# Patient Record
Sex: Male | Born: 1980 | Race: Black or African American | Hispanic: No | Marital: Single | Smoking: Never smoker
Health system: Southern US, Community
[De-identification: ages and names within clinical notes are randomized; demographics above are authoritative.]

---

## 2016-07-06 DIAGNOSIS — M7731 Calcaneal spur, right foot: Secondary | ICD-10-CM | POA: Diagnosis not present

## 2016-07-06 DIAGNOSIS — M7732 Calcaneal spur, left foot: Secondary | ICD-10-CM | POA: Diagnosis not present

## 2016-07-06 DIAGNOSIS — M722 Plantar fascial fibromatosis: Secondary | ICD-10-CM | POA: Diagnosis not present

## 2016-07-30 DIAGNOSIS — H5213 Myopia, bilateral: Secondary | ICD-10-CM | POA: Insufficient documentation

## 2016-08-03 DIAGNOSIS — M722 Plantar fascial fibromatosis: Secondary | ICD-10-CM | POA: Diagnosis not present

## 2016-08-03 DIAGNOSIS — M7732 Calcaneal spur, left foot: Secondary | ICD-10-CM | POA: Diagnosis not present

## 2016-08-03 DIAGNOSIS — M7731 Calcaneal spur, right foot: Secondary | ICD-10-CM | POA: Diagnosis not present

## 2016-08-28 ENCOUNTER — Encounter (HOSPITAL_BASED_OUTPATIENT_CLINIC_OR_DEPARTMENT_OTHER): Payer: Self-pay | Admitting: Emergency Medicine

## 2016-08-28 ENCOUNTER — Emergency Department (HOSPITAL_BASED_OUTPATIENT_CLINIC_OR_DEPARTMENT_OTHER)
Admission: EM | Admit: 2016-08-28 | Discharge: 2016-08-29 | Disposition: A | Discharge status: Home / Self Care | Payer: BLUE CROSS/BLUE SHIELD | Attending: Emergency Medicine | Admitting: Emergency Medicine

## 2016-08-28 ENCOUNTER — Emergency Department (HOSPITAL_BASED_OUTPATIENT_CLINIC_OR_DEPARTMENT_OTHER): Payer: BLUE CROSS/BLUE SHIELD

## 2016-08-28 DIAGNOSIS — R198 Other specified symptoms and signs involving the digestive system and abdomen: Secondary | ICD-10-CM

## 2016-08-28 DIAGNOSIS — J029 Acute pharyngitis, unspecified: Secondary | ICD-10-CM | POA: Diagnosis not present

## 2016-08-28 DIAGNOSIS — F458 Other somatoform disorders: Secondary | ICD-10-CM | POA: Diagnosis not present

## 2016-08-28 DIAGNOSIS — R0781 Pleurodynia: Secondary | ICD-10-CM | POA: Insufficient documentation

## 2016-08-28 DIAGNOSIS — R221 Localized swelling, mass and lump, neck: Secondary | ICD-10-CM | POA: Diagnosis present

## 2016-08-28 DIAGNOSIS — R0989 Other specified symptoms and signs involving the circulatory and respiratory systems: Secondary | ICD-10-CM

## 2016-08-28 DIAGNOSIS — M546 Pain in thoracic spine: Secondary | ICD-10-CM | POA: Diagnosis not present

## 2016-08-28 NOTE — ED Triage Notes (Signed)
Patient reports that he was taking melioxicam for foot pain. He started to have throat swelling to his throat about a week after taking the meloicam  - he reports that he starting taking that about mid way through feb. The patient reports that he started having back spasms as well. He is here today because of the chronic throat swelling and back spasms

## 2016-08-28 NOTE — ED Provider Notes (Signed)
[facility] Provider Note: Marcus DellJ. Lane Olivette Beckmann, MD, FACEP  CSN: [id number] MRN: [id number] ARRIVAL: 08/28/16 at 1953 ROOM: [facility]03/[facility]03  By signing my name below, I, Marcus Robbins, attest that this documentation has been prepared under the direction and in the presence of Marcus LibraJohn Jovann Luse, MD. Electronically Signed: Alyssa GroveMartin Robbins, ED Scribe. 08/28/16. 11:24 PM.  CHIEF COMPLAINT  Throat Swelling   HISTORY OF PRESENT ILLNESS  HPI Comments: Marcus Robbins is a 36 y.o. male who presents to the Emergency Department complaining of gradual onset, Mild to moderate constant sensation of throat swelling for approximately 1 week. He began taking meloxicam mid February for foot pain, but took his last dose 4-5 days ago. After stopping meloxicam he still feels as if his throat is swollen.   He also complains of intermitent middle lower thoracic back back for 3 days. He describes the pain as sharp internal pains that are brought on with deep inhalation, less so with cough. Pt denies recent prolonged travel. Denies shortness of breath, fever, leg pain, leg swelling.   History reviewed. No pertinent past medical history.  History reviewed. No pertinent surgical history.  History reviewed. No pertinent family history.  Social History  Substance Use Topics  . Smoking status: Never Smoker  . Smokeless tobacco: Never Used  . Alcohol use No    Prior to Admission medications   Not on File    Allergies Patient has no known allergies.   REVIEW OF SYSTEMS  Negative except as noted here or in the History of Present Illness.   PHYSICAL EXAMINATION  Initial Vital Signs Blood pressure 139/84, pulse 60, temperature 99.1 F (37.3 C), temperature source Oral, resp. rate 16, height 5\' 7"  (1.702 m), weight 202 lb (91.6 kg), SpO2 100 %.  Examination General: Well-developed, well-nourished male in no acute distress; appearance consistent with age of record HENT: normocephalic; atraumatic; pharynx normal;  no dysphonia or stridor Eyes: pupils equal, round and reactive to light; extraocular muscles intact Neck: supple Heart: regular rate and rhythm; no murmurs, rubs or gallops Lungs: clear to auscultation bilaterally Abdomen: soft; nondistended; nontender; no masses or hepatosplenomegaly; bowel sounds present Extremities: No deformity; full range of motion; pulses normal; no edema Back: Nontender Neurologic: Awake, alert and oriented; motor function intact in all extremities and symmetric; no facial droop Skin: Warm and dry Psychiatric: Normal mood and affect  RESULTS  Summary of this visit's results, reviewed by myself:   EKG Interpretation  Date/Time:    Ventricular Rate:    PR Interval:    QRS Duration:   QT Interval:    QTC Calculation:   R Axis:     Text Interpretation:        Laboratory Studies: No results found for this or any previous visit (from the past 24 hour(s)). Imaging Studies: Dg Neck Soft Tissue  Result Date: 08/28/2016 CLINICAL DATA:  Initial evaluation for acute onset throat tightening, pain on inspiration. EXAM: NECK SOFT TISSUES - 1+ VIEW COMPARISON:  None. FINDINGS: There is no evidence of retropharyngeal soft tissue swelling or epiglottic enlargement. The cervical airway is unremarkable and no radio-opaque foreign body identified. IMPRESSION: No acute abnormality identified within the soft tissues of the neck. No significant soft tissue swelling. Patent airway. Electronically Signed   By: Rise MuBenjamin  McClintock M.D.   On: 08/28/2016 23:56   Dg Chest 2 View  Result Date: 08/28/2016 CLINICAL DATA:  Acute onset of sensation of throat tightening. Recently began new medication. EXAM: CHEST  2 VIEW COMPARISON:  None. FINDINGS: The heart size and mediastinal contours are within normal limits. Both lungs are clear. The visualized skeletal structures are unremarkable. IMPRESSION: No active cardiopulmonary disease. Electronically Signed   By: Ellery Plunk M.D.   On:  08/28/2016 23:53    ED COURSE  Nursing notes and initial vitals signs, including pulse oximetry, reviewed.  Vitals:   08/28/16 2006 08/28/16 2221  BP: 137/89 139/84  Pulse: 69 60  Resp: 16 16  Temp: 98.2 F (36.8 C) 99.1 F (37.3 C)  TempSrc: Oral Oral  SpO2: 100% 100%  Weight: 202 lb (91.6 kg)   Height: 5\' 7"  (1.702 m)    12:02 AM Patient's throat symptoms may be related to acid reflux as he was recently treated with a course of low back, and NSAID. We will place him on a PPI. PERC negative for PE. Will treat symptomatically.  PROCEDURES    ED DIAGNOSES     ICD-9-CM ICD-10-CM   1. Globus sensation 306.4 F45.8   2. Pleuritic chest pain 786.52 R07.81     I personally performed the services described in this documentation, which was scribed in my presence. The recorded information has been reviewed and is accurate.    Marcus Libra, MD 08/29/16 [phone]

## 2016-08-29 MED ORDER — HYDROCODONE-ACETAMINOPHEN 5-325 MG PO TABS: 1.0000 | ORAL_TABLET | ORAL | 0 refills | Status: DC | PRN

## 2016-08-29 MED ORDER — PANTOPRAZOLE SODIUM 40 MG PO TBEC
40.0000 mg | DELAYED_RELEASE_TABLET | Freq: Once | ORAL | Status: AC
  Administered 2016-08-29: 40 mg via ORAL
  Filled 2016-08-29: qty 1

## 2016-08-29 MED ORDER — OMEPRAZOLE 20 MG PO CPDR: 20.0000 mg | DELAYED_RELEASE_CAPSULE | Freq: Every day | ORAL | 0 refills | Status: DC

## 2016-08-29 NOTE — ED Notes (Signed)
ED Provider at bedside. 

## 2016-08-29 NOTE — ED Notes (Signed)
Pt verbalizes understanding of d/c instructions and denies any further needs at this time. 

## 2016-11-20 ENCOUNTER — Ambulatory Visit: Payer: BLUE CROSS/BLUE SHIELD | Admitting: Family

## 2016-11-27 ENCOUNTER — Ambulatory Visit (INDEPENDENT_AMBULATORY_CARE_PROVIDER_SITE_OTHER): Payer: BLUE CROSS/BLUE SHIELD | Admitting: Family

## 2016-11-27 ENCOUNTER — Encounter: Payer: Self-pay | Admitting: Family

## 2016-11-27 VITALS — BP 123/77 | HR 54 | Temp 98.2°F | Resp 18 | Ht 67.0 in | Wt 191.8 lb

## 2016-11-27 DIAGNOSIS — Z0000 Encounter for general adult medical examination without abnormal findings: Secondary | ICD-10-CM

## 2016-11-27 DIAGNOSIS — Z23 Encounter for immunization: Secondary | ICD-10-CM

## 2016-11-27 LAB — BASIC METABOLIC PANEL
BUN: 24 mg/dL — AB (ref 6–23)
CALCIUM: 10 mg/dL (ref 8.4–10.5)
CHLORIDE: 105 meq/L (ref 96–112)
CO2: 29 meq/L (ref 19–32)
CREATININE: 1.15 mg/dL (ref 0.40–1.50)
GFR: 93.39 mL/min (ref >60.00)
Glucose, Bld: 102 mg/dL — ABNORMAL HIGH (ref 70–99)
Potassium: 4.3 mEq/L (ref 3.5–5.1)
Sodium: 139 mEq/L (ref 135–145)

## 2016-11-27 LAB — CBC WITH DIFFERENTIAL/PLATELET
BASOS PCT: 2.4 % (ref 0.0–3.0)
Basophils Absolute: 0.1 10*3/uL (ref 0.0–0.1)
EOS ABS: 0.1 10*3/uL (ref 0.0–0.7)
Eosinophils Relative: 2.9 % (ref 0.0–5.0)
HCT: 46.3 % (ref 39.0–52.0)
Hemoglobin: 14.5 g/dL (ref 13.0–17.0)
Lymphocytes Relative: 48 % — ABNORMAL HIGH (ref 12.0–46.0)
Lymphs Abs: 1.9 10*3/uL (ref 0.7–4.0)
MCHC: 31.3 g/dL (ref 30.0–36.0)
MCV: 70 fl — ABNORMAL LOW (ref 78.0–100.0)
Monocytes Absolute: 0.4 10*3/uL (ref 0.1–1.0)
Monocytes Relative: 10.2 % (ref 3.0–12.0)
NEUTROS ABS: 1.5 10*3/uL (ref 1.4–7.7)
NEUTROS PCT: 36.5 % — AB (ref 43.0–77.0)
PLATELETS: 290 10*3/uL (ref 150.0–400.0)
RBC: 6.62 Mil/uL — ABNORMAL HIGH (ref 4.22–5.81)
RDW: 15 % (ref 11.5–15.5)
WBC: 4 10*3/uL (ref 4.0–10.5)

## 2016-11-27 LAB — LIPID PANEL
CHOL/HDL RATIO: 4
Cholesterol: 200 mg/dL (ref 0–200)
HDL: 52.5 mg/dL (ref >39.00)
LDL CALC: 124 mg/dL — AB (ref 0–99)
NonHDL: 147.05
TRIGLYCERIDES: 113 mg/dL (ref 0.0–149.0)
VLDL: 22.6 mg/dL (ref 0.0–40.0)

## 2016-11-27 LAB — URINALYSIS, ROUTINE W REFLEX MICROSCOPIC
Bilirubin Urine: NEGATIVE
Hgb urine dipstick: NEGATIVE
KETONES UR: NEGATIVE
Leukocytes, UA: NEGATIVE
Nitrite: NEGATIVE
RBC / HPF: NONE SEEN (ref 0–?)
Specific Gravity, Urine: >=1.03 — AB (ref 1.000–1.030)
Total Protein, Urine: NEGATIVE
UROBILINOGEN UA: 0.2 (ref 0.0–1.0)
Urine Glucose: NEGATIVE
WBC UA: NONE SEEN (ref 0–?)
pH: 6 (ref 5.0–8.0)

## 2016-11-27 LAB — HEPATIC FUNCTION PANEL
ALT: 20 U/L (ref 0–53)
AST: 16 U/L (ref 0–37)
Albumin: 4.5 g/dL (ref 3.5–5.2)
Alkaline Phosphatase: 63 U/L (ref 39–117)
BILIRUBIN DIRECT: 0 mg/dL (ref 0.0–0.3)
BILIRUBIN TOTAL: 0.4 mg/dL (ref 0.2–1.2)
Total Protein: 7.6 g/dL (ref 6.0–8.3)

## 2016-11-27 LAB — TSH: TSH: 0.75 u[IU]/mL (ref 0.35–4.50)

## 2016-11-27 NOTE — Patient Instructions (Addendum)
Please work on adding regular cardio to your routine- goal is 30 minutes 5 days a week. Work on incorporating more healthy homemade meals and avoiding fast food. Complete lab work prior to leaving.

## 2016-11-27 NOTE — Progress Notes (Signed)
Subjective:    Patient ID: Marcus Robbins Rule, male    DOB: 08/03/1980, 36 y.o.   MRN: [id number]  HPI  [name] is a 36 yr old male who presents today to establish care.   Patient presents today for complete physical.  Immunizations: last tetanus as >10 years Diet: diet "could be better."  Eats too much fast food Exercise: no formal exercise Dental:  Up to date Vision: up to date   Review of Systems  Constitutional: Negative for unexpected weight change.  HENT: Negative for hearing loss and rhinorrhea.   Eyes: Negative for visual disturbance.  Respiratory: Negative for cough and shortness of breath.   Cardiovascular: Negative for chest pain and leg swelling.  Gastrointestinal: Negative for blood in stool, constipation and diarrhea.  Genitourinary: Negative for dysuria and frequency.  Musculoskeletal: Negative for arthralgias and myalgias.  Skin: Negative for rash.  Neurological: Negative for headaches.  Psychiatric/Behavioral:       Denies depression/anxiety   No past medical history on file.   Social History   Social History  . Marital status: Single    Spouse name: N/A  . Number of children: N/A  . Years of education: N/A   Occupational History  . Not on file.   Social History Main Topics  . Smoking status: Never Smoker  . Smokeless tobacco: Never Used  . Alcohol use No  . Drug use: No  . Sexual activity: Yes    Partners: Female   Other Topics Concern  . Not on file   Social History Narrative   Works in a warehouse   Not married   No children   Completed associates degree   Enjoys playing pool, video games.     No past surgical history on file.  Family History  Problem Relation Age of Onset  . Hypertension Mother     Allergies  Allergen Reactions  . Meloxicam     Throat swelling/sob    No current outpatient prescriptions on file prior to visit.   No current facility-administered medications on file prior to visit.     BP 123/77 (BP  Location: Left Arm, Cuff Size: Normal)   Pulse (!) 54   Temp 98.2 F (36.8 C) (Oral)   Resp 18   Ht 5\' 7"  (1.702 m)   Wt 191 lb 12.8 oz (87 kg)   SpO2 100%   BMI 30.04 kg/m       Objective:   Physical Exam  Physical Exam  Constitutional: mildly overweight appearing AA male. He is oriented to person, place, and time. He appears well-developed and well-nourished. No distress.  HENT:  Head: Normocephalic and atraumatic.  Right Ear: cerumen impaction is noted.  After removal of cerumen tympanic membrane and ear canal appear normal.  Left Ear: Tympanic membrane and ear canal normal.  Mouth/Throat: Oropharynx is clear and moist.  Eyes: Pupils are equal, round, and reactive to light. No scleral icterus.  Neck: Normal range of motion. No thyromegaly present.  Cardiovascular: Normal rate and regular rhythm.   No murmur heard. Pulmonary/Chest: Effort normal and breath sounds normal. No respiratory distress. He has no wheezes. He has no rales. He exhibits no tenderness.  Abdominal: Soft. Bowel sounds are normal. He exhibits no distension and no mass. There is no tenderness. There is no rebound and no guarding.  Musculoskeletal: He exhibits no edema.  Lymphadenopathy:    He has no cervical adenopathy.  Neurological: He is alert and oriented to person, place, and time.  He has normal patellar reflexes. He exhibits normal muscle tone. Coordination normal.  Skin: Skin is warm and dry.  Psychiatric: He has a normal mood and affect. His behavior is normal. Judgment and thought content normal.           Assessment & Plan:   Preventative care- discussed healthier diet/exercise and weight loss. Goal weight around 170. Tdpap today as well as routine lab work.  Cerumen plug was removed from the right ear with lavage.       Assessment & Plan:

## 2016-11-27 NOTE — Addendum Note (Signed)
Addended by: Mervin KungFERGERSON, Niamh Rada on: 11/27/2016 11:15 AM   Modules accepted: Orders

## 2016-11-30 ENCOUNTER — Encounter: Payer: Self-pay | Admitting: Family

## 2016-11-30 LAB — HIV ANTIBODY (ROUTINE TESTING W REFLEX): HIV 1&2 Ab, 4th Generation: NONREACTIVE

## 2017-10-11 ENCOUNTER — Encounter: Payer: Self-pay | Admitting: Family

## 2017-10-11 ENCOUNTER — Ambulatory Visit: Payer: BLUE CROSS/BLUE SHIELD | Admitting: Family

## 2017-10-11 VITALS — BP 137/65 | HR 65 | Temp 97.9°F | Resp 18 | Ht 66.5 in | Wt 192.4 lb

## 2017-10-11 DIAGNOSIS — Z113 Encounter for screening for infections with a predominantly sexual mode of transmission: Secondary | ICD-10-CM | POA: Diagnosis not present

## 2017-10-11 NOTE — Progress Notes (Signed)
Subjective:    Patient ID: Marcus Robbins, male    DOB: 07/04/1980, 37 y.o.   MRN: [id number]  HPI   [name] is a 37 yr old male who presents today requesting STD testing.  He reports that he is only sexually active with women. He denies new sexual partners or any symptoms of STD's. Specifically he denies dysuria, penile discharge or penile lesions.    Review of Systems No past medical history on file.   Social History   Socioeconomic History  . Marital status: Single    Spouse name: Not on file  . Number of children: Not on file  . Years of education: Not on file  . Highest education level: Not on file  Occupational History  . Not on file  Social Needs  . Financial resource strain: Not on file  . Food insecurity:    Worry: Not on file    Inability: Not on file  . Transportation needs:    Medical: Not on file    Non-medical: Not on file  Tobacco Use  . Smoking status: Never Smoker  . Smokeless tobacco: Never Used  Substance and Sexual Activity  . Alcohol use: No  . Drug use: No  . Sexual activity: Yes    Partners: Female  Lifestyle  . Physical activity:    Days per week: Not on file    Minutes per session: Not on file  . Stress: Not on file  Relationships  . Social connections:    Talks on phone: Not on file    Gets together: Not on file    Attends religious service: Not on file    Active member of club or organization: Not on file    Attends meetings of clubs or organizations: Not on file    Relationship status: Not on file  . Intimate partner violence:    Fear of current or ex partner: Not on file    Emotionally abused: Not on file    Physically abused: Not on file    Forced sexual activity: Not on file  Other Topics Concern  . Not on file  Social History Narrative   Works in a warehouse   Not married   No children   Completed associates degree   Enjoys playing pool, video games.     No past surgical history on file.  Family History  Problem  Relation Age of Onset  . Hypertension Mother     Allergies  Allergen Reactions  . Meloxicam     Throat swelling/sob    No current outpatient medications on file prior to visit.   No current facility-administered medications on file prior to visit.     BP 137/65 (BP Location: Right Arm, Cuff Size: Large)   Pulse 65   Temp 97.9 F (36.6 C) (Oral)   Resp 18   Ht 5' 6.5" (1.689 m)   Wt 192 lb 6.4 oz (87.3 kg)   SpO2 100%   BMI 30.59 kg/m       Objective:   Physical Exam  Constitutional: He is oriented to person, place, and time. He appears well-developed and well-nourished. No distress.  HENT:  Head: Normocephalic and atraumatic.  Cardiovascular: Normal rate and regular rhythm.  No murmur heard. Pulmonary/Chest: Effort normal and breath sounds normal. No respiratory distress. He has no wheezes. He has no rales.  Musculoskeletal: He exhibits no edema.  Neurological: He is alert and oriented to person, place, and time.  Skin: Skin is  warm and dry.  Psychiatric: He has a normal mood and affect. His behavior is normal. Thought content normal.   STD screening.       Assessment & Plan:  STD screening- will obtain screening for HIV, hepatitis, syphilis, herpes type 2, gonorrhea and chlamydia.

## 2017-10-11 NOTE — Patient Instructions (Signed)
Please complete lab work prior to leaving.   

## 2017-10-12 LAB — RPR: RPR Ser Ql: NONREACTIVE

## 2017-10-12 LAB — HEPATITIS PANEL, ACUTE
HEP A IGM: NONREACTIVE
HEP B S AG: NONREACTIVE
Hep B C IgM: NONREACTIVE
Hepatitis C Ab: NONREACTIVE
SIGNAL TO CUT-OFF: 0.01 (ref <1.00)

## 2017-10-12 LAB — HIV ANTIBODY (ROUTINE TESTING W REFLEX): HIV: NONREACTIVE

## 2017-10-12 LAB — C. TRACHOMATIS/N. GONORRHOEAE RNA
C. TRACHOMATIS RNA, TMA: NOT DETECTED
N. GONORRHOEAE RNA, TMA: NOT DETECTED

## 2017-10-12 LAB — HSV 2 ANTIBODY, IGG

## 2017-12-03 ENCOUNTER — Encounter: Payer: BLUE CROSS/BLUE SHIELD | Admitting: Family

## 2017-12-10 ENCOUNTER — Encounter: Payer: BLUE CROSS/BLUE SHIELD | Admitting: Family

## 2017-12-14 ENCOUNTER — Encounter: Payer: Self-pay | Admitting: Family

## 2017-12-14 ENCOUNTER — Ambulatory Visit (INDEPENDENT_AMBULATORY_CARE_PROVIDER_SITE_OTHER): Payer: BLUE CROSS/BLUE SHIELD | Admitting: Family

## 2017-12-14 VITALS — BP 127/73 | HR 54 | Temp 98.4°F | Resp 16 | Ht 67.0 in | Wt 207.0 lb

## 2017-12-14 DIAGNOSIS — Z0000 Encounter for general adult medical examination without abnormal findings: Secondary | ICD-10-CM | POA: Diagnosis not present

## 2017-12-14 LAB — HEPATIC FUNCTION PANEL
ALT: 46 U/L (ref 0–53)
AST: 21 U/L (ref 0–37)
Albumin: 4.4 g/dL (ref 3.5–5.2)
Alkaline Phosphatase: 60 U/L (ref 39–117)
Bilirubin, Direct: 0.1 mg/dL (ref 0.0–0.3)
Total Bilirubin: 0.4 mg/dL (ref 0.2–1.2)
Total Protein: 6.9 g/dL (ref 6.0–8.3)

## 2017-12-14 LAB — LIPID PANEL
CHOLESTEROL: 195 mg/dL (ref 0–200)
HDL: 64.1 mg/dL (ref >39.00)
LDL CALC: 108 mg/dL — AB (ref 0–99)
NonHDL: 130.6
TRIGLYCERIDES: 114 mg/dL (ref 0.0–149.0)
Total CHOL/HDL Ratio: 3
VLDL: 22.8 mg/dL (ref 0.0–40.0)

## 2017-12-14 LAB — URINALYSIS, ROUTINE W REFLEX MICROSCOPIC
Bilirubin Urine: NEGATIVE
HGB URINE DIPSTICK: NEGATIVE
KETONES UR: NEGATIVE
Leukocytes, UA: NEGATIVE
NITRITE: NEGATIVE
RBC / HPF: NONE SEEN (ref 0–?)
Specific Gravity, Urine: >=1.03 — AB (ref 1.000–1.030)
Total Protein, Urine: NEGATIVE
UROBILINOGEN UA: 0.2 (ref 0.0–1.0)
Urine Glucose: NEGATIVE
pH: 5.5 (ref 5.0–8.0)

## 2017-12-14 LAB — CBC WITH DIFFERENTIAL/PLATELET
BASOS PCT: 1.3 % (ref 0.0–3.0)
Basophils Absolute: 0.1 10*3/uL (ref 0.0–0.1)
EOS PCT: 2.5 % (ref 0.0–5.0)
Eosinophils Absolute: 0.1 10*3/uL (ref 0.0–0.7)
HCT: 42.2 % (ref 39.0–52.0)
HEMOGLOBIN: 13.5 g/dL (ref 13.0–17.0)
LYMPHS ABS: 1.9 10*3/uL (ref 0.7–4.0)
Lymphocytes Relative: 41.3 % (ref 12.0–46.0)
MCHC: 32 g/dL (ref 30.0–36.0)
MCV: 69.4 fl — ABNORMAL LOW (ref 78.0–100.0)
Monocytes Absolute: 0.5 10*3/uL (ref 0.1–1.0)
Monocytes Relative: 9.7 % (ref 3.0–12.0)
NEUTROS ABS: 2.1 10*3/uL (ref 1.4–7.7)
Neutrophils Relative %: 45.2 % (ref 43.0–77.0)
PLATELETS: 295 10*3/uL (ref 150.0–400.0)
RBC: 6.08 Mil/uL — ABNORMAL HIGH (ref 4.22–5.81)
RDW: 14.5 % (ref 11.5–15.5)
WBC: 4.7 10*3/uL (ref 4.0–10.5)

## 2017-12-14 LAB — BASIC METABOLIC PANEL
BUN: 20 mg/dL (ref 6–23)
CHLORIDE: 104 meq/L (ref 96–112)
CO2: 28 meq/L (ref 19–32)
Calcium: 9.7 mg/dL (ref 8.4–10.5)
Creatinine, Ser: 1.14 mg/dL (ref 0.40–1.50)
GFR: 93.76 mL/min (ref >60.00)
Glucose, Bld: 72 mg/dL (ref 70–99)
Potassium: 4.1 mEq/L (ref 3.5–5.1)
SODIUM: 140 meq/L (ref 135–145)

## 2017-12-14 LAB — TSH: TSH: 1.16 u[IU]/mL (ref 0.35–4.50)

## 2017-12-14 NOTE — Progress Notes (Signed)
Subjective:    Patient ID: Marcus Robbins, male    DOB: 11-14-1980, 37 y.o.   MRN: [id number]  HPI  Patient presents today for complete physical.  Immunizations: tdap 2018 Diet: needs  Wt Readings from Last 3 Encounters:  12/14/17 207 lb (93.9 kg)  10/11/17 192 lb 6.4 oz (87.3 kg)  11/27/16 191 lb 12.8 oz (87 kg)  Exercise: walks at work, works a Presenter, broadcasting Vision: 2/19 Dental: scheduled    Review of Systems  Constitutional: Positive for unexpected weight change.  HENT: Negative for hearing loss and rhinorrhea.   Eyes: Negative for visual disturbance.  Respiratory: Negative for cough and shortness of breath.   Cardiovascular: Negative for chest pain and leg swelling.  Gastrointestinal: Negative for blood in stool, constipation and diarrhea.  Genitourinary: Negative for hematuria.  Musculoskeletal: Negative for arthralgias and myalgias.       Reports that his back has been bothering him, not using shoe inserts and he is waiting on new orthotics  Skin: Negative for rash.  Neurological: Negative for headaches.  Hematological: Negative for adenopathy.  Psychiatric/Behavioral:       Denies depression/anxiety   No past medical history on file.   Social History   Socioeconomic History  . Marital status: Single    Spouse name: Not on file  . Number of children: Not on file  . Years of education: Not on file  . Highest education level: Not on file  Occupational History  . Not on file  Social Needs  . Financial resource strain: Not on file  . Food insecurity:    Worry: Not on file    Inability: Not on file  . Transportation needs:    Medical: Not on file    Non-medical: Not on file  Tobacco Use  . Smoking status: Never Smoker  . Smokeless tobacco: Never Used  Substance and Sexual Activity  . Alcohol use: No  . Drug use: No  . Sexual activity: Yes    Partners: Female  Lifestyle  . Physical activity:    Days per week: Not on file    Minutes per session: Not on  file  . Stress: Not on file  Relationships  . Social connections:    Talks on phone: Not on file    Gets together: Not on file    Attends religious service: Not on file    Active member of club or organization: Not on file    Attends meetings of clubs or organizations: Not on file    Relationship status: Not on file  . Intimate partner violence:    Fear of current or ex partner: Not on file    Emotionally abused: Not on file    Physically abused: Not on file    Forced sexual activity: Not on file  Other Topics Concern  . Not on file  Social History Narrative   Works in a warehouse   Not married   No children   Completed associates degree   Enjoys playing pool, video games.     No past surgical history on file.  Family History  Problem Relation Age of Onset  . Hypertension Mother     Allergies  Allergen Reactions  . Meloxicam     Throat swelling/sob    No current outpatient medications on file prior to visit.   No current facility-administered medications on file prior to visit.     BP 127/73 (BP Location: Right Arm, Patient Position: Sitting, Cuff Size: Large)  Pulse (!) 54   Temp 98.4 F (36.9 C) (Oral)   Resp 16   Ht 5\' 7"  (1.702 m)   Wt 207 lb (93.9 kg)   SpO2 96%   BMI 32.42 kg/m       Objective:   Physical Exam  Physical Exam  Constitutional: He is oriented to person, place, and time. He appears well-developed and well-nourished. No distress.  HENT:  Head: Normocephalic and atraumatic.  Right Ear: Tympanic membrane and ear canal normal.  Left Ear: Tympanic membrane and ear canal normal.  Mouth/Throat: Oropharynx is clear and moist.  Eyes: Pupils are equal, round, and reactive to light. No scleral icterus.  Neck: Normal range of motion. No thyromegaly present.  Cardiovascular: Normal rate and regular rhythm.   No murmur heard. Pulmonary/Chest: Effort normal and breath sounds normal. No respiratory distress. He has no wheezes. He has no rales.  He exhibits no tenderness.  Abdominal: Soft. Bowel sounds are normal. He exhibits no distension and no mass. There is no tenderness. There is no rebound and no guarding.  Musculoskeletal: He exhibits no edema.  Lymphadenopathy:    He has no cervical adenopathy.  Neurological: He is alert and oriented to person, place, and time. He has normal patellar reflexes. He exhibits normal muscle tone. Coordination normal.  Skin: Skin is warm and dry.  Psychiatric: He has a normal mood and affect. His behavior is normal. Judgment and thought content normal.           Assessment & Plan:   Preventative care- discussed healthy diet, exercise, weight loss.  Immunizations reviewed and up to date. Will obtain routine lab work.       Assessment & Plan:

## 2017-12-14 NOTE — Patient Instructions (Signed)
Please work on healthy diet, exercise, weight loss. Complete lab work prior to leaving.  

## 2018-09-06 ENCOUNTER — Ambulatory Visit: Payer: BLUE CROSS/BLUE SHIELD | Admitting: Family

## 2018-09-06 ENCOUNTER — Encounter: Payer: Self-pay | Admitting: Family

## 2018-09-06 ENCOUNTER — Other Ambulatory Visit (HOSPITAL_COMMUNITY)
Admission: RE | Admit: 2018-09-06 | Discharge: 2018-09-06 | Disposition: A | Discharge status: Home / Self Care | Payer: BLUE CROSS/BLUE SHIELD | Source: Ambulatory Visit | Attending: Family | Admitting: Family

## 2018-09-06 ENCOUNTER — Other Ambulatory Visit: Payer: Self-pay

## 2018-09-06 VITALS — BP 136/84 | HR 74 | Temp 99.7°F | Resp 16 | Ht 67.0 in | Wt 215.0 lb

## 2018-09-06 DIAGNOSIS — J029 Acute pharyngitis, unspecified: Secondary | ICD-10-CM

## 2018-09-06 DIAGNOSIS — Z7251 High risk heterosexual behavior: Secondary | ICD-10-CM | POA: Insufficient documentation

## 2018-09-06 DIAGNOSIS — J020 Streptococcal pharyngitis: Secondary | ICD-10-CM | POA: Diagnosis not present

## 2018-09-06 LAB — POCT RAPID STREP A (OFFICE): Rapid Strep A Screen: POSITIVE — AB

## 2018-09-06 MED ORDER — AMOXICILLIN 500 MG PO CAPS: 500.0000 mg | ORAL_CAPSULE | Freq: Three times a day (TID) | ORAL | 0 refills | Status: DC

## 2018-09-06 NOTE — Progress Notes (Signed)
Subjective:    Patient ID: Marcus Robbins, male    DOB: 07-17-1980, 38 y.o.   MRN: [id number]  HPI   Mr. Prickett is a 38 yr old male who presents today with c/o sore throat since Saturday. Also reports temp up to 100.6, and bilateral ear pain L>R. Reports that he started feeling badly on Saturday night. Notes + low back pain.  Denies cough.  Denies SOB.  No flu shot this year. No know sick contacts. No recent travel.   Requesting STD panel.  Last intercourse was earlier this month male.    Review of Systems  See HPI  No past medical history on file.   Social History   Socioeconomic History  . Marital status: Single    Spouse name: Not on file  . Number of children: Not on file  . Years of education: Not on file  . Highest education level: Not on file  Occupational History  . Not on file  Social Needs  . Financial resource strain: Not on file  . Food insecurity:    Worry: Not on file    Inability: Not on file  . Transportation needs:    Medical: Not on file    Non-medical: Not on file  Tobacco Use  . Smoking status: Never Smoker  . Smokeless tobacco: Never Used  Substance and Sexual Activity  . Alcohol use: No  . Drug use: No  . Sexual activity: Yes    Partners: Female  Lifestyle  . Physical activity:    Days per week: Not on file    Minutes per session: Not on file  . Stress: Not on file  Relationships  . Social connections:    Talks on phone: Not on file    Gets together: Not on file    Attends religious service: Not on file    Active member of club or organization: Not on file    Attends meetings of clubs or organizations: Not on file    Relationship status: Not on file  . Intimate partner violence:    Fear of current or ex partner: Not on file    Emotionally abused: Not on file    Physically abused: Not on file    Forced sexual activity: Not on file  Other Topics Concern  . Not on file  Social History Narrative   Works in a warehouse   Not married   No children   Completed associates degree   Enjoys playing pool, video games.     No past surgical history on file.  Family History  Problem Relation Age of Onset  . Hypertension Mother   . Seizures Sister     Allergies  Allergen Reactions  . Meloxicam     Throat swelling/sob    No current outpatient medications on file prior to visit.   No current facility-administered medications on file prior to visit.     BP 136/84 (BP Location: Right Arm, Patient Position: Sitting, Cuff Size: Large)   Pulse 74   Temp 99.7 F (37.6 C) (Oral)   Resp 16   Ht 5\' 7"  (1.702 m)   Wt 215 lb (97.5 kg)   SpO2 99%   BMI 33.67 kg/m        Objective:   Physical Exam HENT:     Head: Normocephalic and atraumatic.     Right Ear: Tympanic membrane and ear canal normal.     Left Ear: Tympanic membrane and ear canal normal.  Mouth/Throat:     Pharynx: Posterior oropharyngeal erythema present. No oropharyngeal exudate.     Comments: Poor visualization of posterior oropharynx due to thick tongue/gagging.   Neurological:     Mental Status: He is alert.           Assessment & Plan:  Strep pharyngitis- will rx with amoxicillin.  Pt is advised as follows:  Please begin amoxicillin for strep throat. For pain you may use tylenol every 6 hours as needed. In addition you may use cepachol lozenges or chloraseptic spray. Please call if new/worsening symptoms or if not improved in 2-3 days.   High risk sexual behavior- obtain gc/chlamydia, rpr, HIV, Hep B antigen and herpes testing. Discussed need to repeat hiv screening 90 days after potential exposure to ensure that test remains negative.

## 2018-09-06 NOTE — Patient Instructions (Signed)
Please begin amoxicillin for strep throat. For pain you may use ibuprofen 600mg  every 6 hours as needed. In addition you may use cepachol lozenges or chloraseptic spray. Please call if new/worsening symptoms or if not improved in 2-3 days.

## 2018-09-07 LAB — URINE CYTOLOGY ANCILLARY ONLY
Chlamydia: NEGATIVE
Neisseria Gonorrhea: NEGATIVE
TRICH (WINDOWPATH): NEGATIVE

## 2018-09-09 ENCOUNTER — Encounter: Payer: Self-pay | Admitting: Family

## 2018-09-09 LAB — HIV ANTIBODY (ROUTINE TESTING W REFLEX): HIV 1&2 Ab, 4th Generation: NONREACTIVE

## 2018-09-09 LAB — HSV 1/2 AB (IGM), IFA W/RFLX TITER
HSV 1 IgM Screen: NEGATIVE
HSV 2 IgM Screen: NEGATIVE

## 2018-09-09 LAB — HEPATITIS B SURFACE ANTIGEN: HEP B S AG: NONREACTIVE

## 2018-09-09 LAB — HSV 2 ANTIBODY, IGG

## 2018-09-09 LAB — RPR: RPR: NONREACTIVE

## 2018-09-12 ENCOUNTER — Telehealth: Payer: Self-pay | Admitting: *Deleted

## 2018-09-12 NOTE — Telephone Encounter (Signed)
Received FMLA/STD paperwork from [facility] [employer] [facility]; will call pt and verify all days out of work/SLS 03/23

## 2018-09-16 NOTE — Telephone Encounter (Signed)
Did not receive return call from pt, so completed as much as possible using OV date and RTW date from provider's letter;forwarded to provider/SLS 03/27

## 2018-12-14 IMAGING — CR DG NECK SOFT TISSUE
2 series · 2 of 2 positions shown · non-contrast
Comparison: None.

CLINICAL DATA: Initial evaluation for acute onset throat
tightening, pain on inspiration.

EXAM:
NECK SOFT TISSUES - 1+ VIEW

[w soft tissue neck]
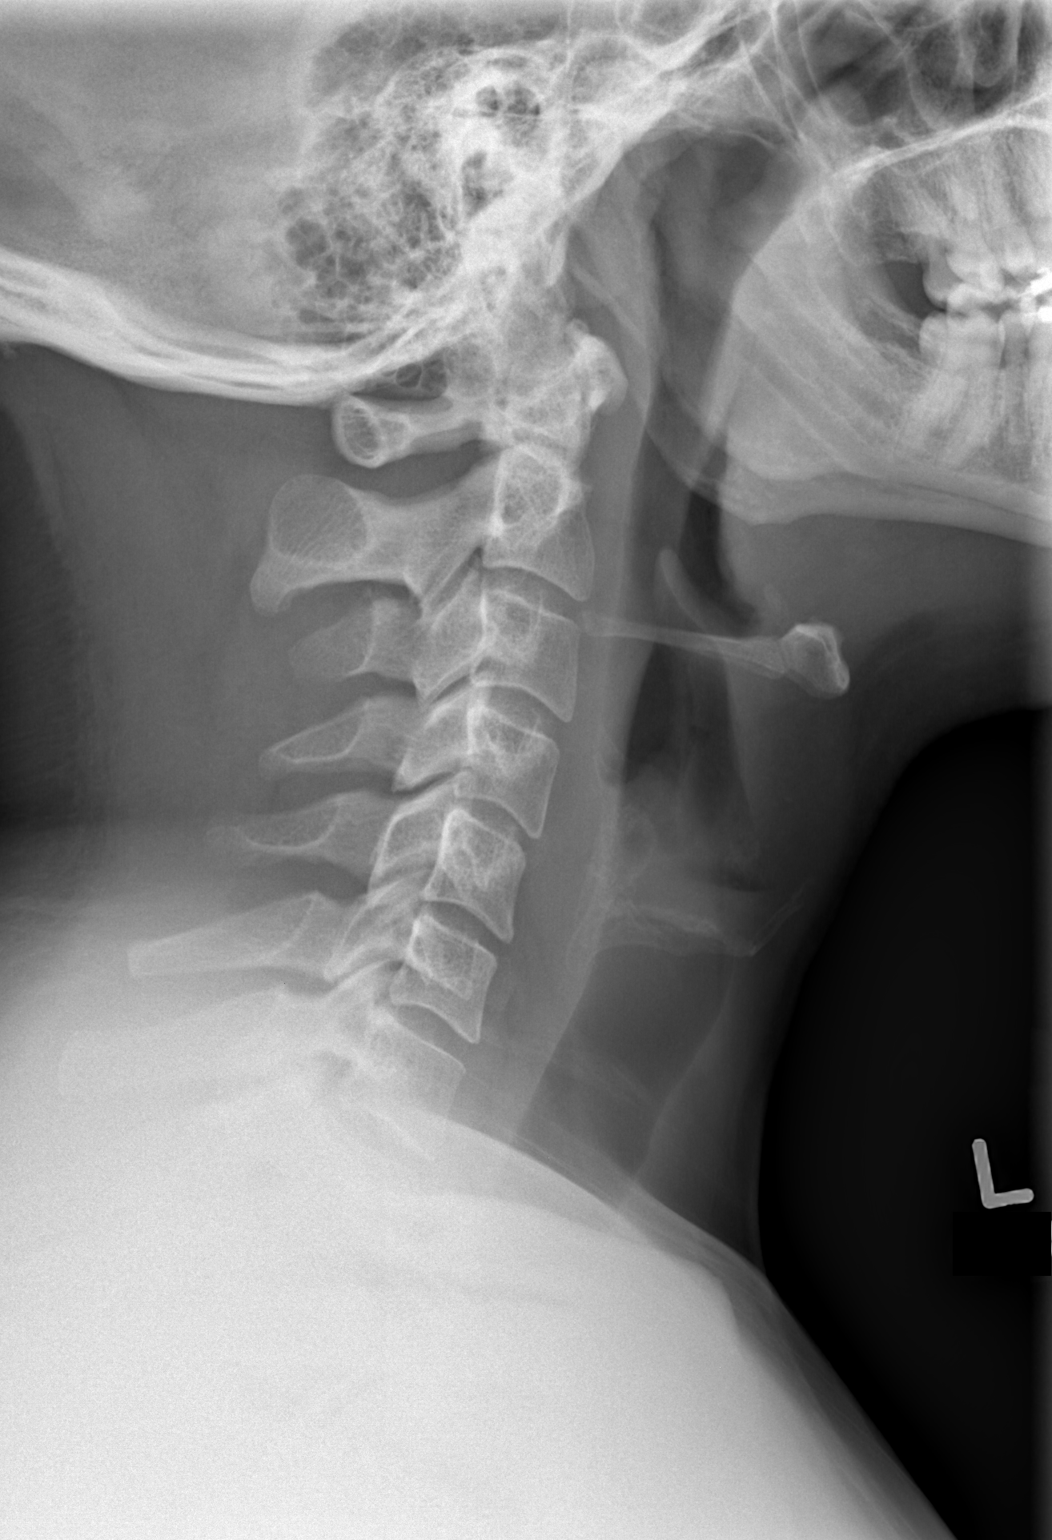

[w soft tissue neck ap]
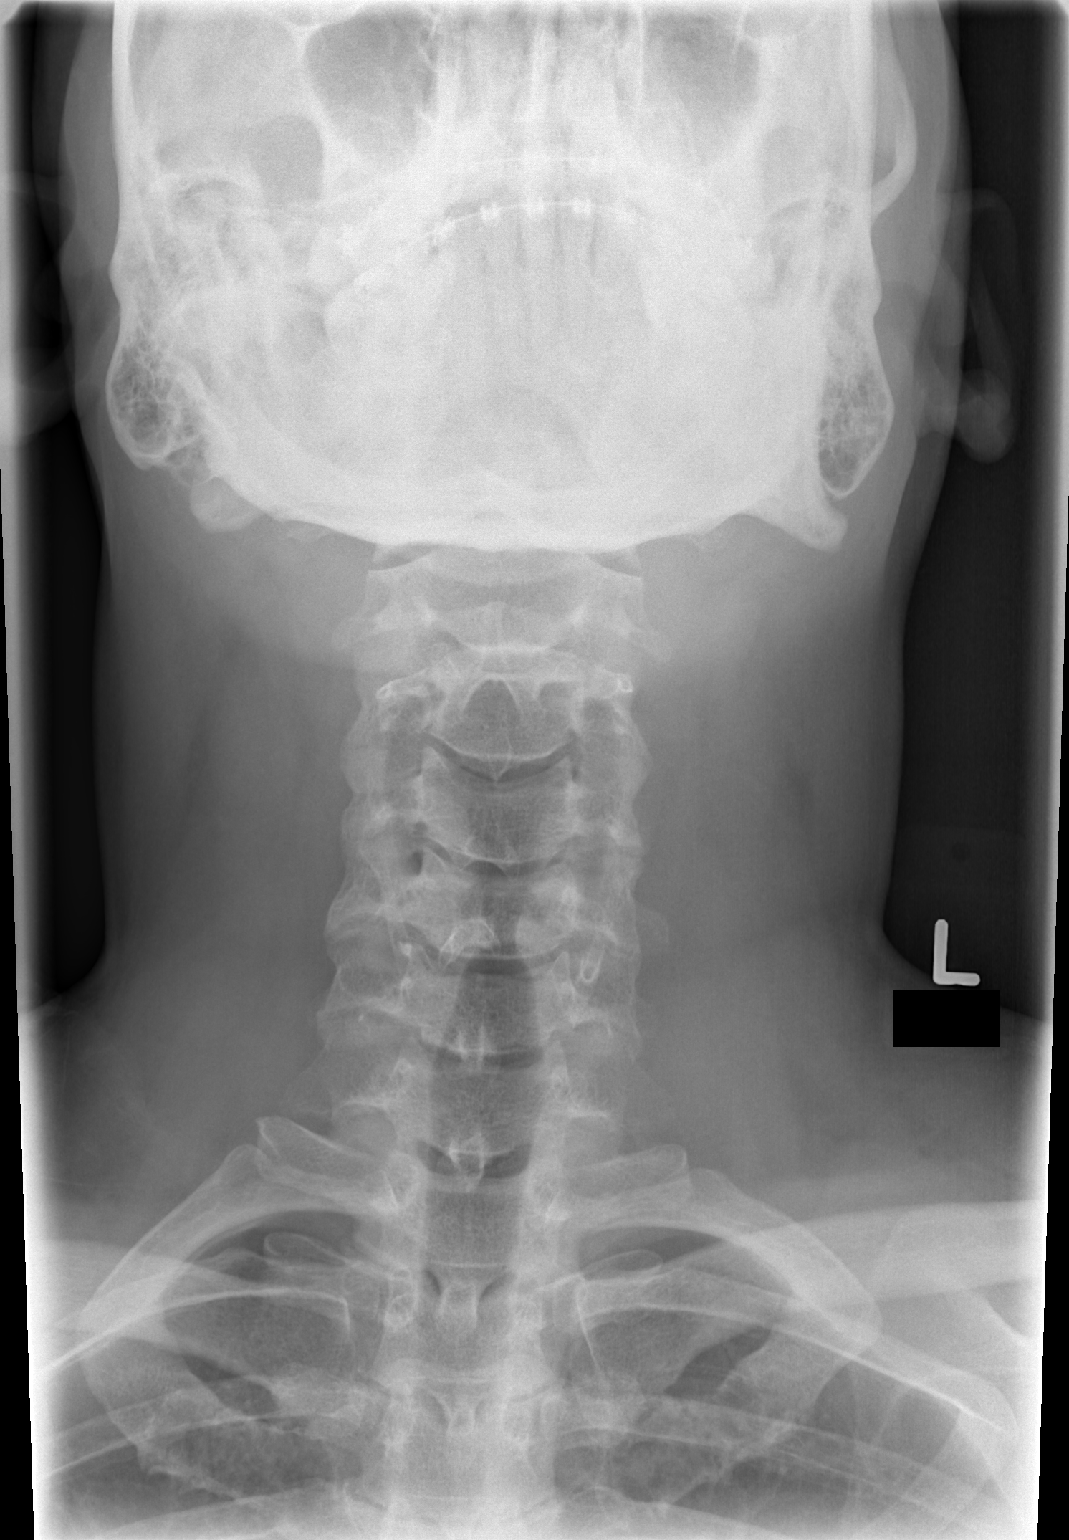

[2 of 2 positions shown; findings below may reference images not displayed]

FINDINGS: There is no evidence of retropharyngeal soft tissue swelling or
epiglottic enlargement. The cervical airway is unremarkable and no
radio-opaque foreign body identified.
IMPRESSION: No acute abnormality identified within the soft tissues of the neck.
No significant soft tissue swelling. Patent airway.

## 2019-10-19 ENCOUNTER — Other Ambulatory Visit: Payer: Self-pay | Admitting: Podiatry

## 2019-10-19 ENCOUNTER — Other Ambulatory Visit: Payer: Self-pay

## 2019-10-19 ENCOUNTER — Ambulatory Visit (INDEPENDENT_AMBULATORY_CARE_PROVIDER_SITE_OTHER): Payer: BC Managed Care – PPO

## 2019-10-19 ENCOUNTER — Ambulatory Visit (INDEPENDENT_AMBULATORY_CARE_PROVIDER_SITE_OTHER): Payer: BC Managed Care – PPO | Admitting: Podiatry

## 2019-10-19 ENCOUNTER — Encounter: Payer: Self-pay | Admitting: Podiatry

## 2019-10-19 VITALS — BP 134/84 | HR 80 | Temp 97.1°F

## 2019-10-19 DIAGNOSIS — M722 Plantar fascial fibromatosis: Secondary | ICD-10-CM

## 2019-10-19 DIAGNOSIS — M2141 Flat foot [pes planus] (acquired), right foot: Secondary | ICD-10-CM | POA: Diagnosis not present

## 2019-10-19 DIAGNOSIS — M778 Other enthesopathies, not elsewhere classified: Secondary | ICD-10-CM

## 2019-10-19 DIAGNOSIS — M2142 Flat foot [pes planus] (acquired), left foot: Secondary | ICD-10-CM

## 2019-10-19 MED ORDER — CELECOXIB 200 MG PO CAPS: 200.0000 mg | ORAL_CAPSULE | Freq: Every day | ORAL | 3 refills | Status: DC

## 2019-10-19 NOTE — Patient Instructions (Signed)

## 2019-10-19 NOTE — Progress Notes (Signed)
ROS:  Subjective:  Patient ID: Marcus Robbins, male    DOB: [birth year],  MRN: [id number] HPI Chief Complaint  Patient presents with  . Foot Pain    Arch and plantar heel bilateral - aching x years intermittent, AM pain, seen other docs for plantar fasciitis-had inserts made, MRI, has not gotten better  . New Patient (Initial Visit)    39 y.o. male presents with the above complaint.   Denies fever chills nausea vomiting muscle aches pains calf pain back pain chest pain shortness of breath.  No past medical history on file. No past surgical history on file.  Current Outpatient Medications:  .  celecoxib (CELEBREX) 200 MG capsule, Take 1 capsule (200 mg total) by mouth daily., Disp: 30 capsule, Rfl: 3  Allergies  Allergen Reactions  . Meloxicam     Throat swelling/sob   Review of Systems Objective:   Vitals:   10/19/19 0859  BP: 134/84  Pulse: 80  Temp: (!) 97.1 F (36.2 C)    General: Well developed, nourished, in no acute distress, alert and oriented x3   Dermatological: Skin is warm, dry and supple bilateral. Nails x 10 are well maintained; remaining integument appears unremarkable at this time. There are no open sores, no preulcerative lesions, no rash or signs of infection present.  Vascular: Dorsalis Pedis artery and Posterior Tibial artery pedal pulses are 2/4 bilateral with immedate capillary fill time. Pedal hair growth present. No varicosities and no lower extremity edema present bilateral.   Neruologic: Grossly intact via light touch bilateral. Vibratory intact via tuning fork bilateral. Protective threshold with Semmes Wienstein monofilament intact to all pedal sites bilateral. Patellar and Achilles deep tendon reflexes 2+ bilateral. No Babinski or clonus noted bilateral.   Musculoskeletal: No gross boney pedal deformities bilateral. No pain, crepitus, or limitation noted with foot and ankle range of motion bilateral. Muscular strength 5/5 in all groups tested  bilateral.  Pain on palpation medial longitudinal arch.  Gait: Unassisted, Nonantalgic.    Radiographs:  Radiographs taken today demonstrate pes planus soft tissue increase in density is not really demonstrated otherwise is relatively unremarkable foot.  No osseous abnormalities identified.  Assessment & Plan:   Assessment: Mild pes planus probable plantar fasciitis.  Plan: Started him on Celebrex 200 mg once daily to help him through the day at work.  I also want to get him some orthotics he was scanned today.     Lita Flynn T. [place], [place]

## 2019-11-09 ENCOUNTER — Ambulatory Visit: Payer: BC Managed Care – PPO | Admitting: Orthotics

## 2019-11-09 ENCOUNTER — Other Ambulatory Visit: Payer: Self-pay

## 2019-11-09 DIAGNOSIS — M2141 Flat foot [pes planus] (acquired), right foot: Secondary | ICD-10-CM

## 2019-11-09 DIAGNOSIS — M722 Plantar fascial fibromatosis: Secondary | ICD-10-CM

## 2019-11-09 NOTE — Progress Notes (Signed)
Patient came in today to pick up custom made foot orthotics.  The goals were accomplished and the patient reported no dissatisfaction with said orthotics.  Patient was advised of breakin period and how to report any issues. 

## 2019-11-13 ENCOUNTER — Other Ambulatory Visit: Payer: Self-pay | Admitting: *Deleted

## 2019-11-13 DIAGNOSIS — M722 Plantar fascial fibromatosis: Secondary | ICD-10-CM

## 2019-11-13 DIAGNOSIS — M2141 Flat foot [pes planus] (acquired), right foot: Secondary | ICD-10-CM

## 2019-11-13 MED ORDER — CELECOXIB 200 MG PO CAPS: 200.0000 mg | ORAL_CAPSULE | Freq: Every day | ORAL | 3 refills | Status: DC

## 2019-12-06 ENCOUNTER — Other Ambulatory Visit: Payer: Self-pay

## 2019-12-06 ENCOUNTER — Encounter (HOSPITAL_COMMUNITY): Payer: Self-pay

## 2019-12-06 ENCOUNTER — Ambulatory Visit (HOSPITAL_COMMUNITY)
Admission: EM | Admit: 2019-12-06 | Discharge: 2019-12-06 | Disposition: A | Discharge status: Home / Self Care | Payer: BC Managed Care – PPO | Attending: Urgent Care | Admitting: Urgent Care

## 2019-12-06 DIAGNOSIS — R070 Pain in throat: Secondary | ICD-10-CM | POA: Diagnosis not present

## 2019-12-06 DIAGNOSIS — Z888 Allergy status to other drugs, medicaments and biological substances status: Secondary | ICD-10-CM | POA: Diagnosis not present

## 2019-12-06 DIAGNOSIS — B349 Viral infection, unspecified: Secondary | ICD-10-CM

## 2019-12-06 DIAGNOSIS — R52 Pain, unspecified: Secondary | ICD-10-CM

## 2019-12-06 DIAGNOSIS — Z20822 Contact with and (suspected) exposure to covid-19: Secondary | ICD-10-CM | POA: Diagnosis not present

## 2019-12-06 DIAGNOSIS — R509 Fever, unspecified: Secondary | ICD-10-CM

## 2019-12-06 LAB — POCT RAPID STREP A: Streptococcus, Group A Screen (Direct): NEGATIVE

## 2019-12-06 MED ORDER — ACETAMINOPHEN 325 MG PO TABS
650.0000 mg | ORAL_TABLET | Freq: Once | ORAL | Status: AC
  Administered 2019-12-06: 650 mg via ORAL

## 2019-12-06 MED ORDER — ACETAMINOPHEN 325 MG PO TABS
ORAL_TABLET | ORAL | Status: AC
  Filled 2019-12-06: qty 2

## 2019-12-06 NOTE — ED Provider Notes (Signed)
[facility]   MRN: [id number] DOB: 11-13-1980  Subjective:   Marcus Robbins is a 39 y.o. male presenting for acute onset of throat pain, body aches, white spots on his throat, difficulty swallowing.  Patient has not tried medications for relief.  Denies Covid vaccination, has not had COVID-19.  No current facility-administered medications for this encounter.  Current Outpatient Medications:  .  celecoxib (CELEBREX) 200 MG capsule, Take 1 capsule (200 mg total) by mouth daily., Disp: 30 capsule, Rfl: 3   Allergies  Allergen Reactions  . Meloxicam     Throat swelling/sob    History reviewed. No pertinent past medical history.   History reviewed. No pertinent surgical history.  Family History  Problem Relation Age of Onset  . Hypertension Mother   . Seizures Sister     Social History   Tobacco Use  . Smoking status: Never Smoker  . Smokeless tobacco: Never Used  Substance Use Topics  . Alcohol use: No  . Drug use: No    Review of Systems  Constitutional: Positive for fever. Negative for malaise/fatigue.  HENT: Positive for sore throat. Negative for congestion, ear pain and sinus pain.   Eyes: Negative for discharge and redness.  Respiratory: Negative for cough, hemoptysis, shortness of breath and wheezing.   Cardiovascular: Negative for chest pain.  Gastrointestinal: Negative for abdominal pain, diarrhea, nausea and vomiting.  Genitourinary: Negative for dysuria, flank pain and hematuria.  Musculoskeletal: Positive for myalgias.  Skin: Negative for rash.  Neurological: Negative for dizziness, weakness and headaches.  Psychiatric/Behavioral: Negative for depression and substance abuse.     Objective:   Vitals: BP 128/72 (BP Location: Right Arm)   Pulse 88   Temp 98.2 F (36.8 C) (Oral)   Resp 18   SpO2 99%   Patient insisted that vital signs were not taken.  I did did them despite seeing documentation of vital signs.  BP 137/88, pulse 84,  temperature 100.7 F, pulse oximetry 98%, respirations 16.  Wt Readings from Last 3 Encounters:  09/06/18 215 lb (97.5 kg)  12/14/17 207 lb (93.9 kg)  10/11/17 192 lb 6.4 oz (87.3 kg)   Temp Readings from Last 3 Encounters:  12/06/19 98.2 F (36.8 C) (Oral)  10/19/19 (!) 97.1 F (36.2 C)  09/06/18 99.7 F (37.6 C) (Oral)   BP Readings from Last 3 Encounters:  12/06/19 128/72  10/19/19 134/84  09/06/18 136/84   Pulse Readings from Last 3 Encounters:  12/06/19 88  10/19/19 80  09/06/18 74   Physical Exam Constitutional:      General: He is not in acute distress.    Appearance: Normal appearance. He is normal weight. He is not ill-appearing.  HENT:     Head: Normocephalic and atraumatic.     Right Ear: Tympanic membrane, ear canal and external ear normal. There is no impacted cerumen.     Left Ear: Tympanic membrane, ear canal and external ear normal. There is no impacted cerumen.     Nose: Nose normal. No congestion or rhinorrhea.     Mouth/Throat:     Mouth: Mucous membranes are moist.     Pharynx: Posterior oropharyngeal erythema present. No oropharyngeal exudate.  Eyes:     General: No scleral icterus.       Right eye: No discharge.        Left eye: No discharge.     Extraocular Movements: Extraocular movements intact.     Conjunctiva/sclera: Conjunctivae normal.     Pupils: Pupils  are equal, round, and reactive to light.  Cardiovascular:     Rate and Rhythm: Normal rate.  Pulmonary:     Effort: Pulmonary effort is normal.  Musculoskeletal:     Cervical back: Normal range of motion and neck Robbins. No rigidity. No muscular tenderness.  Neurological:     General: No focal deficit present.     Mental Status: He is alert and oriented to person, place, and time.  Psychiatric:        Mood and Affect: Mood normal.        Behavior: Behavior normal.     Results for orders placed or performed during the hospital encounter of 12/06/19 (from the past 24 hour(s))    POCT rapid strep A [facility])     Status: None   Collection Time: 12/06/19  7:15 PM  Result Value Ref Range   Streptococcus, Group A Screen (Direct) NEGATIVE NEGATIVE    Assessment and Plan :   PDMP not reviewed this encounter.  1. Fever, unspecified   2. Throat pain   3. Body aches   4. Viral syndrome     Will manage for viral illness such as viral URI, viral syndrome, viral rhinitis, COVID-19. Counseled patient on nature of COVID-19 including modes of transmission, diagnostic testing, management and supportive care.  Offered scripts for symptomatic relief. COVID 19 testing is pending. Counseled patient on potential for adverse effects with medications prescribed/recommended today, ER and return-to-clinic precautions discussed, patient verbalized understanding.     Wallis Bamberg, [place] 12/06/19 1928

## 2019-12-06 NOTE — Discharge Instructions (Signed)

## 2019-12-06 NOTE — ED Triage Notes (Signed)
Pt presents today for body aches x1 hour. Pt denies meds prior to arrival. Pt denies relieving factors. Pt denies n/v/d. Pt denies fever, chills, runny nose, congestion, sore throat. Pt denies sick contacts.

## 2019-12-07 ENCOUNTER — Emergency Department (HOSPITAL_BASED_OUTPATIENT_CLINIC_OR_DEPARTMENT_OTHER)
Admission: EM | Admit: 2019-12-07 | Discharge: 2019-12-08 | Disposition: A | Discharge status: Home / Self Care | Payer: BC Managed Care – PPO | Attending: Emergency Medicine | Admitting: Emergency Medicine

## 2019-12-07 ENCOUNTER — Other Ambulatory Visit: Payer: Self-pay

## 2019-12-07 ENCOUNTER — Encounter (HOSPITAL_BASED_OUTPATIENT_CLINIC_OR_DEPARTMENT_OTHER): Payer: Self-pay | Admitting: *Deleted

## 2019-12-07 DIAGNOSIS — R0981 Nasal congestion: Secondary | ICD-10-CM | POA: Diagnosis not present

## 2019-12-07 DIAGNOSIS — J00 Acute nasopharyngitis [common cold]: Secondary | ICD-10-CM | POA: Insufficient documentation

## 2019-12-07 LAB — SARS CORONAVIRUS 2 (TAT 6-24 HRS): SARS Coronavirus 2: NEGATIVE

## 2019-12-07 NOTE — ED Triage Notes (Addendum)
Pt c/o sinus pressure, sore throat  x 3 days, seen at [facility] yesterday for same, neg strep neg covid

## 2019-12-08 NOTE — Discharge Instructions (Signed)
You may take over-the-counter medicine for symptomatic relief, such as Tylenol, Motrin, TheraFlu, Alka seltzer , black elderberry, etc. Please limit acetaminophen (Tylenol) to 4000 mg and Ibuprofen (Motrin, Advil, etc.) to 2400 mg for a 24hr period. Please note that other over-the-counter medicine may contain acetaminophen or ibuprofen as a component of their ingredients.   

## 2019-12-08 NOTE — ED Provider Notes (Signed)
[facility] EMERGENCY DEPARTMENT Provider Note  CSN: [id number] Arrival date & time: 12/07/19 2252  Chief Complaint(s) Sinus Problem  HPI Holman Kingbird is a 39 y.o. male who presents to the emergency department with several days of nasal congestion, sore throat and myalgias.  Patient was seen 2 days ago at urgent care and diagnosed with viral URI.  He tested negative for COVID-19 and strep throat at that time.  Reports that he has been attempting to treat his symptoms with over-the-counter medicine with minimal relief.  Presents for reevaluation.  He denies any associated nausea or vomiting.  No chest pain or shortness of breath.  No abdominal pain.  No diarrhea.  No other physical complaints.  HPI  Past Medical History History reviewed. No pertinent past medical history. Patient Active Problem List   Diagnosis Date Noted  . Myopia of both eyes 07/30/2016   Home Medication(s) Prior to Admission medications   Medication Sig Start Date End Date Taking? Authorizing Provider  celecoxib (CELEBREX) 200 MG capsule Take 1 capsule (200 mg total) by mouth daily. 11/13/19   Hyatt, Romilda Garret, DPM                                                                                                                                    Past Surgical History History reviewed. No pertinent surgical history. Family History Family History  Problem Relation Age of Onset  . Hypertension Mother   . Seizures Sister     Social History Social History   Tobacco Use  . Smoking status: Never Smoker  . Smokeless tobacco: Never Used  Substance Use Topics  . Alcohol use: No  . Drug use: No   Allergies Meloxicam  Review of Systems Review of Systems All other systems are reviewed and are negative for acute change except as noted in the HPI  Physical Exam Vital Signs  I have reviewed the triage vital signs BP (!) 154/100   Pulse 89   Resp 18   Ht 5\' 6"  (1.676 m)   Wt 95.3 kg   SpO2 100%   BMI  33.89 kg/m   Physical Exam Vitals reviewed.  Constitutional:      General: He is not in acute distress.    Appearance: He is well-developed. He is not diaphoretic.  HENT:     Head: Normocephalic and atraumatic.     Jaw: No trismus.     Right Ear: External ear normal.     Left Ear: External ear normal.     Nose: Mucosal edema, congestion and rhinorrhea present.     Mouth/Throat:     Tonsils: Tonsillar exudate present. 2+ on the right. 2+ on the left.  Eyes:     General: No scleral icterus.    Conjunctiva/sclera: Conjunctivae normal.  Neck:     Trachea: Phonation normal.  Cardiovascular:     Rate and Rhythm: Normal rate and regular rhythm.  Pulmonary:     Effort: Pulmonary effort is normal. No respiratory distress.     Breath sounds: No stridor.  Abdominal:     General: There is no distension.  Musculoskeletal:        General: Normal range of motion.     Cervical back: Normal range of motion.  Neurological:     Mental Status: He is alert and oriented to person, place, and time.  Psychiatric:        Behavior: Behavior normal.     ED Results and Treatments Labs (all labs ordered are listed, but only abnormal results are displayed) Labs Reviewed - No data to display                                                                                                                       EKG  EKG Interpretation  Date/Time:    Ventricular Rate:    PR Interval:    QRS Duration:   QT Interval:    QTC Calculation:   R Axis:     Text Interpretation:        Radiology No results found.  Pertinent labs & imaging results that were available during my care of the patient were reviewed by me and considered in my medical decision making (see chart for details).  Medications Ordered in ED Medications - No data to display                                                                                                                                   Procedures Procedures  (including critical care time)  Medical Decision Making / ED Course I have reviewed the nursing notes for this encounter and the patient's prior records (if available in EHR or on provided paperwork).   Nader Dugo was evaluated in Emergency Department on 12/08/2019 for the symptoms described in the history of present illness. He was evaluated in the context of the global COVID-19 pandemic, which necessitated consideration that the patient might be at risk for infection with the SARS-CoV-2 virus that causes COVID-19. Institutional protocols and algorithms that pertain to the evaluation of patients at risk for COVID-19 are in a state of rapid change based on information released by regulatory bodies including the CDC and federal and state organizations. These policies and algorithms were followed during the patient's care in the ED.  Patient presents with viral symptoms. adequate oral hydration. Rest  of history as above.  Patient appears well. No signs of toxicity, patient is interactive. No hypoxia, tachypnea or other signs of respiratory distress. No sign of clinical dehydration. Lung exam clear. Rest of exam as above.  Most consistent with viral illness   No evidence suggestive of  AOM, PNA, or meningitis.  Chest x-ray not indicated at this time.  Discussed symptomatic treatment with the patient and they will follow closely with their PCP.        Final Clinical Impression(s) / ED Diagnoses Final diagnoses:  Acute nasopharyngitis    The patient appears reasonably screened and/or stabilized for discharge and I doubt any other medical condition or other [facility] requiring further screening, evaluation, or treatment in the ED at this time prior to discharge. Safe for discharge with strict return precautions.  Disposition: Discharge  Condition: Good  I have discussed the results, Dx and Tx plan with the patient/family who expressed understanding and agree(s)  with the plan. Discharge instructions discussed at length. The patient/family was given strict return precautions who verbalized understanding of the instructions. No further questions at time of discharge.    ED Discharge Orders    None       Follow Up: Sandford Craze, NP [address] [place] [address] [phone]  Schedule an appointment as soon as possible for a visit       This chart was dictated using voice recognition software.  Despite best efforts to proofread,  errors can occur which can change the documentation meaning.   Nira Conn, MD 12/08/19 [phone]

## 2019-12-10 LAB — CULTURE, GROUP A STREP (THRC)

## 2019-12-12 ENCOUNTER — Other Ambulatory Visit (HOSPITAL_COMMUNITY)
Admission: RE | Admit: 2019-12-12 | Discharge: 2019-12-12 | Disposition: A | Discharge status: Home / Self Care | Payer: BC Managed Care – PPO | Source: Ambulatory Visit | Attending: Family | Admitting: Family

## 2019-12-12 ENCOUNTER — Other Ambulatory Visit: Payer: Self-pay

## 2019-12-12 ENCOUNTER — Ambulatory Visit (INDEPENDENT_AMBULATORY_CARE_PROVIDER_SITE_OTHER): Payer: BC Managed Care – PPO | Admitting: Family

## 2019-12-12 VITALS — BP 136/82 | HR 72 | Temp 97.2°F | Resp 16 | Ht 67.0 in | Wt 220.0 lb

## 2019-12-12 DIAGNOSIS — Z0000 Encounter for general adult medical examination without abnormal findings: Secondary | ICD-10-CM | POA: Diagnosis not present

## 2019-12-12 DIAGNOSIS — Z113 Encounter for screening for infections with a predominantly sexual mode of transmission: Secondary | ICD-10-CM | POA: Insufficient documentation

## 2019-12-12 LAB — HEPATIC FUNCTION PANEL
ALT: 31 U/L (ref 0–53)
AST: 15 U/L (ref 0–37)
Albumin: 4.5 g/dL (ref 3.5–5.2)
Alkaline Phosphatase: 74 U/L (ref 39–117)
Bilirubin, Direct: 0.1 mg/dL (ref 0.0–0.3)
Total Bilirubin: 0.4 mg/dL (ref 0.2–1.2)
Total Protein: 7.4 g/dL (ref 6.0–8.3)

## 2019-12-12 LAB — TSH: TSH: 1.93 u[IU]/mL (ref 0.35–4.50)

## 2019-12-12 LAB — BASIC METABOLIC PANEL
BUN: 13 mg/dL (ref 6–23)
CO2: 33 mEq/L — ABNORMAL HIGH (ref 19–32)
Calcium: 10.6 mg/dL — ABNORMAL HIGH (ref 8.4–10.5)
Chloride: 102 mEq/L (ref 96–112)
Creatinine, Ser: 1.15 mg/dL (ref 0.40–1.50)
GFR: 86.37 mL/min (ref >60.00)
Glucose, Bld: 95 mg/dL (ref 70–99)
Potassium: 4.7 mEq/L (ref 3.5–5.1)
Sodium: 142 mEq/L (ref 135–145)

## 2019-12-12 LAB — CBC WITH DIFFERENTIAL/PLATELET
Basophils Absolute: 0.1 10*3/uL (ref 0.0–0.1)
Basophils Relative: 0.9 % (ref 0.0–3.0)
Eosinophils Absolute: 0.1 10*3/uL (ref 0.0–0.7)
Eosinophils Relative: 1 % (ref 0.0–5.0)
HCT: 45.7 % (ref 39.0–52.0)
Hemoglobin: 14.4 g/dL (ref 13.0–17.0)
Lymphocytes Relative: 27.2 % (ref 12.0–46.0)
Lymphs Abs: 1.8 10*3/uL (ref 0.7–4.0)
MCHC: 31.5 g/dL (ref 30.0–36.0)
MCV: 70.9 fl — ABNORMAL LOW (ref 78.0–100.0)
Monocytes Absolute: 0.6 10*3/uL (ref 0.1–1.0)
Monocytes Relative: 9 % (ref 3.0–12.0)
Neutro Abs: 4.1 10*3/uL (ref 1.4–7.7)
Neutrophils Relative %: 61.9 % (ref 43.0–77.0)
Platelets: 375 10*3/uL (ref 150.0–400.0)
RBC: 6.44 Mil/uL — ABNORMAL HIGH (ref 4.22–5.81)
RDW: 14.7 % (ref 11.5–15.5)
WBC: 6.7 10*3/uL (ref 4.0–10.5)

## 2019-12-12 LAB — LIPID PANEL
Cholesterol: 206 mg/dL — ABNORMAL HIGH (ref 0–200)
HDL: 42.9 mg/dL (ref >39.00)
LDL Cholesterol: 129 mg/dL — ABNORMAL HIGH (ref 0–99)
NonHDL: 163.21
Total CHOL/HDL Ratio: 5
Triglycerides: 171 mg/dL — ABNORMAL HIGH (ref 0.0–149.0)
VLDL: 34.2 mg/dL (ref 0.0–40.0)

## 2019-12-12 NOTE — Patient Instructions (Addendum)
Please complete lab work prior to leaving. Work on [employer], exercise, weight loss.   Preventive Care 77-37 Years Old, Male Preventive care refers to lifestyle choices and visits with your health care provider that can promote health and wellness. This includes:  A yearly physical exam. This is also called an annual well check.  Regular dental and eye exams.  Immunizations.  Screening for certain conditions.  Healthy lifestyle choices, such as eating a healthy diet, getting regular exercise, not using drugs or products that contain nicotine and tobacco, and limiting alcohol use. What can I expect for my preventive care visit? Physical exam Your health care provider will check:  Height and weight. These may be used to calculate body mass index (BMI), which is a measurement that tells if you are at a healthy weight.  Heart rate and blood pressure.  Your skin for abnormal spots. Counseling Your health care provider may ask you questions about:  Alcohol, tobacco, and drug use.  Emotional well-being.  Home and relationship well-being.  Sexual activity.  Eating habits.  Work and work Statistician. What immunizations do I need?  Influenza (flu) vaccine  This is recommended every year. Tetanus, diphtheria, and pertussis (Tdap) vaccine  You may need a Td booster every 10 years. Varicella (chickenpox) vaccine  You may need this vaccine if you have not already been vaccinated. Human papillomavirus (HPV) vaccine  If recommended by your health care provider, you may need three doses over 6 months. Measles, mumps, and rubella (MMR) vaccine  You may need at least one dose of MMR. You may also need a second dose. Meningococcal conjugate (MenACWY) vaccine  One dose is recommended if you are 64-13 years old and a Market researcher living in a residence hall, or if you have one of several medical conditions. You may also need additional booster doses. Pneumococcal  conjugate (PCV13) vaccine  You may need this if you have certain conditions and were not previously vaccinated. Pneumococcal polysaccharide (PPSV23) vaccine  You may need one or two doses if you smoke cigarettes or if you have certain conditions. Hepatitis A vaccine  You may need this if you have certain conditions or if you travel or work in places where you may be exposed to hepatitis A. Hepatitis B vaccine  You may need this if you have certain conditions or if you travel or work in places where you may be exposed to hepatitis B. Haemophilus influenzae type b (Hib) vaccine  You may need this if you have certain risk factors. You may receive vaccines as individual doses or as more than one vaccine together in one shot (combination vaccines). Talk with your health care provider about the risks and benefits of combination vaccines. What tests do I need? Blood tests  Lipid and cholesterol levels. These may be checked every 5 years starting at age 58.  Hepatitis C test.  Hepatitis B test. Screening   Diabetes screening. This is done by checking your blood sugar (glucose) after you have not eaten for a while (fasting).  Sexually transmitted disease (STD) testing. Talk with your health care provider about your test results, treatment options, and if necessary, the need for more tests. Follow these instructions at home: Eating and drinking   Eat a diet that includes fresh fruits and vegetables, whole grains, lean protein, and low-fat dairy products.  Take vitamin and mineral supplements as recommended by your health care provider.  Do not drink alcohol if your health care provider tells you  not to drink.  If you drink alcohol: ? Limit how much you have to 0-2 drinks a day. ? Be aware of how much alcohol is in your drink. In the [place] one drink equals one 12 oz bottle of beer (355 mL), one 5 oz glass of wine (148 mL), or one 1 oz glass of hard liquor (44 mL). Lifestyle  Take  daily care of your teeth and gums.  Stay active. Exercise for at least 30 minutes on 5 or more days each week.  Do not use any products that contain nicotine or tobacco, such as cigarettes, e-cigarettes, and chewing tobacco. If you need help quitting, ask your health care provider.  If you are sexually active, practice safe sex. Use a condom or other form of protection to prevent STIs (sexually transmitted infections). What's next?  Go to your health care provider once a year for a well check visit.  Ask your health care provider how often you should have your eyes and teeth checked.  Stay up to date on all vaccines. This information is not intended to replace advice given to you by your health care provider. Make sure you discuss any questions you have with your health care provider. Document Revised: 06/02/2018 Document Reviewed: 06/02/2018 Elsevier Patient Education  2020 [employer].

## 2019-12-12 NOTE — Progress Notes (Signed)
Subjective:    Patient ID: Marcus Robbins, male    DOB: [birth year], 39 y.o.   MRN: [id number]  HPI  Patient is a 39 yr old male who presents today for cpx.   Immunizations: tdap 2018, has not received the covid vaccine Diet: reports diet is healthy but  Working a second job.  Does not have time to cook or go to the GYM.    Wt Readings from Last 3 Encounters:  12/12/19 220 lb (99.8 kg)  12/07/19 210 lb (95.3 kg)  09/06/18 215 lb (97.5 kg)   Exercise: no formal exercise Dental: had a cleaning in February. Next one is in September Eye exam- up to date    Review of Systems  Constitutional: Negative for unexpected weight change.  HENT: Negative for hearing loss and rhinorrhea.   Eyes: Negative for visual disturbance.  Respiratory: Negative for cough and shortness of breath.   Cardiovascular: Negative for chest pain.  Gastrointestinal: Negative for constipation and diarrhea.  Genitourinary: Negative for dysuria, frequency and hematuria.  Musculoskeletal: Negative for arthralgias and myalgias.  Skin: Negative for rash.  Neurological: Negative for headaches.  Hematological: Negative for adenopathy.  Psychiatric/Behavioral:       Denies anxiety or depression   No past medical history on file.   Social History   Socioeconomic History  . Marital status: Single    Spouse name: Not on file  . Number of children: Not on file  . Years of education: Not on file  . Highest education level: Not on file  Occupational History  . Not on file  Tobacco Use  . Smoking status: Never Smoker  . Smokeless tobacco: Never Used  Substance and Sexual Activity  . Alcohol use: No  . Drug use: No  . Sexual activity: Yes    Partners: Female  Other Topics Concern  . Not on file  Social History Narrative   Works in a warehouse   Not married   No children   Completed associates degree   Enjoys playing pool, video games.    Social Determinants of Health   Financial Resource Strain:   .  Difficulty of Paying Living Expenses:   Food Insecurity:   . Worried About Programme researcher, broadcasting/film/video in the Last Year:   . Barista in the Last Year:   Transportation Needs:   . Freight forwarder (Medical):   Marland Kitchen Lack of Transportation (Non-Medical):   Physical Activity:   . Days of Exercise per Week:   . Minutes of Exercise per Session:   Stress:   . Feeling of Stress :   Social Connections:   . Frequency of Communication with Friends and Family:   . Frequency of Social Gatherings with Friends and Family:   . Attends Religious Services:   . Active Member of Clubs or Organizations:   . Attends Banker Meetings:   Marland Kitchen Marital Status:   Intimate Partner Violence:   . Fear of Current or Ex-Partner:   . Emotionally Abused:   Marland Kitchen Physically Abused:   . Sexually Abused:     No past surgical history on file.  Family History  Problem Relation Age of Onset  . Hypertension Mother   . Seizures Sister     Allergies  Allergen Reactions  . Meloxicam     Throat swelling/sob    Current Outpatient Medications on File Prior to Visit  Medication Sig Dispense Refill  . celecoxib (CELEBREX) 200 MG capsule Take 1  capsule (200 mg total) by mouth daily. 30 capsule 3   No current facility-administered medications on file prior to visit.    BP 136/82 (BP Location: Right Arm, Patient Position: Sitting, Cuff Size: Large)   Pulse 72   Temp (!) 97.2 F (36.2 C) (Temporal)   Resp 16   Ht 5\' 7"  (1.702 m)   Wt 220 lb (99.8 kg)   SpO2 99%   BMI 34.46 kg/m       Objective:   Physical Exam Constitutional:      General: He is not in acute distress.    Appearance: He is well-developed.  HENT:     Head: Normocephalic and atraumatic.     Ears:     Comments: Cerumen impaction right, L TM normal and left canal is normal Cardiovascular:     Rate and Rhythm: Normal rate and regular rhythm.     Heart sounds: No murmur heard.   Pulmonary:     Effort: Pulmonary effort is  normal. No respiratory distress.     Breath sounds: Normal breath sounds. No wheezing or rales.  Skin:    General: Skin is warm and dry.  Neurological:     General: No focal deficit present.     Mental Status: He is alert and oriented to person, place, and time.  Psychiatric:        Behavior: Behavior normal.        Thought Content: Thought content normal.           Assessment & Plan:  Preventative care- tetanus up to date. Recommended pt to obtain the covid vaccine. Encouraged him to continue to work on [employer], exercise and weight loss.  STD screening- pt requesting STD screening. Asymptomatic.   This visit occurred during the SARS-CoV-2 public health emergency.  Safety protocols were in place, including screening questions prior to the visit, additional usage of staff PPE, and extensive cleaning of exam room while observing appropriate contact time as indicated for disinfecting solutions.

## 2019-12-13 ENCOUNTER — Encounter: Payer: Self-pay | Admitting: Family

## 2019-12-13 ENCOUNTER — Telehealth: Payer: Self-pay | Admitting: Family

## 2019-12-13 LAB — URINE CYTOLOGY ANCILLARY ONLY
Chlamydia: NEGATIVE
Comment: NEGATIVE
Comment: NEGATIVE
Comment: NORMAL
Neisseria Gonorrhea: NEGATIVE
Trichomonas: NEGATIVE

## 2019-12-13 NOTE — Telephone Encounter (Signed)
Std testing is negative.  Cholesterol is elevated. Please work on low fat/low cholesterol diet.   Calcium is a little high.  I would like to repeat his calcium in 2 weeks as ordered.

## 2019-12-14 LAB — RPR+HSVIGM+HBSAG+HSV2(IGG)+...
HIV Screen 4th Generation wRfx: NONREACTIVE
HSV 2 IgG, Type Spec: <0.91 index (ref 0.00–0.90)
HSVI/II Comb IgM: <0.91 Ratio (ref 0.00–0.90)
Hepatitis B Surface Ag: NEGATIVE
RPR Ser Ql: NONREACTIVE

## 2019-12-14 NOTE — Telephone Encounter (Signed)
LMOM informing Pt to return call.  

## 2019-12-15 NOTE — Telephone Encounter (Signed)
Spoke w/ Pt- informed of results and recommendations. Lab appt scheduled.  

## 2020-01-01 ENCOUNTER — Other Ambulatory Visit: Payer: Self-pay

## 2020-01-01 ENCOUNTER — Other Ambulatory Visit (INDEPENDENT_AMBULATORY_CARE_PROVIDER_SITE_OTHER): Payer: BC Managed Care – PPO

## 2020-01-02 LAB — PTH, INTACT AND CALCIUM
Calcium: 9.5 mg/dL (ref 8.6–10.3)
PTH: 46 pg/mL (ref 14–64)

## 2020-03-14 ENCOUNTER — Ambulatory Visit (HOSPITAL_COMMUNITY)
Admission: EM | Admit: 2020-03-14 | Discharge: 2020-03-14 | Disposition: A | Discharge status: Home / Self Care | Payer: HRSA Program | Attending: Internal Medicine | Admitting: Internal Medicine

## 2020-03-14 ENCOUNTER — Other Ambulatory Visit: Payer: Self-pay

## 2020-03-14 DIAGNOSIS — Z20822 Contact with and (suspected) exposure to covid-19: Secondary | ICD-10-CM | POA: Diagnosis present

## 2020-03-14 LAB — SARS CORONAVIRUS 2 (TAT 6-24 HRS): SARS Coronavirus 2: POSITIVE — AB

## 2020-03-14 NOTE — ED Triage Notes (Signed)
Pt here for COVID testing s/p exposure last week. Pt states he currently has no URI sx, fever, chills, body aches, cough, ear pain, n/v/d.  States he did have URI sx last week and had COVID testing which was negative.

## 2020-03-14 NOTE — Discharge Instructions (Signed)

## 2020-07-04 ENCOUNTER — Other Ambulatory Visit: Payer: Self-pay

## 2020-07-05 ENCOUNTER — Other Ambulatory Visit (HOSPITAL_COMMUNITY)
Admission: RE | Admit: 2020-07-05 | Discharge: 2020-07-05 | Disposition: A | Discharge status: Home / Self Care | Payer: BC Managed Care – PPO | Source: Ambulatory Visit | Attending: Family | Admitting: Family

## 2020-07-05 ENCOUNTER — Ambulatory Visit: Payer: BC Managed Care – PPO | Admitting: Family

## 2020-07-05 VITALS — BP 142/83 | HR 64 | Temp 98.6°F | Resp 18 | Wt 235.6 lb

## 2020-07-05 DIAGNOSIS — R300 Dysuria: Secondary | ICD-10-CM

## 2020-07-05 LAB — POCT URINALYSIS DIPSTICK
Bilirubin, UA: NEGATIVE
Blood, UA: NEGATIVE
Glucose, UA: NEGATIVE
Ketones, UA: NEGATIVE
Leukocytes, UA: NEGATIVE
Nitrite, UA: NEGATIVE
Protein, UA: POSITIVE — AB
Spec Grav, UA: >=1.03 — AB (ref 1.010–1.025)
Urobilinogen, UA: 1 E.U./dL
pH, UA: 6 (ref 5.0–8.0)

## 2020-07-05 NOTE — Patient Instructions (Signed)
We will contact you with the results of your lab work

## 2020-07-05 NOTE — Progress Notes (Signed)
Subjective:    Patient ID: Marcus Robbins, male    DOB: 1981/06/09, 40 y.o.   MRN: [id number]  HPI  Patient is a 40 yr old male who presents today wit chief complaint of dysuria.  Pain started the middle of last week on Thursday or Friday.  Denies blood or discharge, abdomen. He has chronic low back pain.  Denies fever. Denies difficulty emptying his bladder.  Reports that this he has discomfort only about 30% of the time when he urinates.  He works 2 jobs and is interested in know what he can do to lose weight.   Review of Systems  See HPI  No past medical history on file.   Social History   Socioeconomic History  . Marital status: Single    Spouse name: Not on file  . Number of children: Not on file  . Years of education: Not on file  . Highest education level: Not on file  Occupational History  . Not on file  Tobacco Use  . Smoking status: Never Smoker  . Smokeless tobacco: Never Used  Substance and Sexual Activity  . Alcohol use: No  . Drug use: No  . Sexual activity: Yes    Partners: Female  Other Topics Concern  . Not on file  Social History Narrative   Works in a warehouse   Not married   No children   Completed associates degree   Enjoys playing pool, video games.    Social Determinants of Health   Financial Resource Strain: Not on file  Food Insecurity: Not on file  Transportation Needs: Not on file  Physical Activity: Not on file  Stress: Not on file  Social Connections: Not on file  Intimate Partner Violence: Not on file    No past surgical history on file.  Family History  Problem Relation Age of Onset  . Hypertension Mother   . Seizures Sister     Allergies  Allergen Reactions  . Meloxicam     Throat swelling/sob    Current Outpatient Medications on File Prior to Visit  Medication Sig Dispense Refill  . celecoxib (CELEBREX) 200 MG capsule Take 1 capsule (200 mg total) by mouth daily. (Patient not taking: Reported on 07/05/2020) 30  capsule 3   No current facility-administered medications on file prior to visit.    BP (!) 142/83 (BP Location: Left Arm, Patient Position: Sitting, Cuff Size: Large)   Pulse 64   Temp 98.6 F (37 C) (Oral)   Resp 18   Wt 235 lb 9.6 oz (106.9 kg)   SpO2 100%   BMI 36.90 kg/m       Objective:   Physical Exam Constitutional:      General: He is not in acute distress.    Appearance: He is well-developed and well-nourished.  HENT:     Head: Normocephalic and atraumatic.  Cardiovascular:     Rate and Rhythm: Normal rate and regular rhythm.     Heart sounds: No murmur heard.   Pulmonary:     Effort: Pulmonary effort is normal. No respiratory distress.     Breath sounds: Normal breath sounds. No wheezing or rales.  Abdominal:     Palpations: Abdomen is soft.     Tenderness: There is no abdominal tenderness. There is no right CVA tenderness or left CVA tenderness.  Musculoskeletal:        General: No edema.  Skin:    General: Skin is warm and dry.  Neurological:  Mental Status: He is alert and oriented to person, place, and time.  Psychiatric:        Mood and Affect: Mood and affect normal.        Behavior: Behavior normal.        Thought Content: Thought content normal.           Assessment & Plan:  Dysuria- UA unremarkable. Will send for culture. Will also send for ancillary testing (GC/Chlamydia).  Further results pending review of results.   Morbid obesity- new.  We discussed trying to exercise on Saturday/Sunday and one weekday to start.  Recommended that he stop drinking sugared beverages (drinks a lot of soda), and meal prepping on Sundays for the week so he has healthy options readily available for the week.   This visit occurred during the SARS-CoV-2 public health emergency.  Safety protocols were in place, including screening questions prior to the visit, additional usage of staff PPE, and extensive cleaning of exam room while observing appropriate  contact time as indicated for disinfecting solutions.

## 2020-07-06 LAB — URINE CULTURE
MICRO NUMBER:: 11420139
Result:: NO GROWTH
SPECIMEN QUALITY:: ADEQUATE

## 2020-07-08 LAB — URINE CYTOLOGY ANCILLARY ONLY
Chlamydia: POSITIVE — AB
Comment: NEGATIVE
Comment: NORMAL
Neisseria Gonorrhea: NEGATIVE

## 2020-07-09 ENCOUNTER — Other Ambulatory Visit: Payer: Self-pay

## 2020-07-09 ENCOUNTER — Telehealth: Payer: Self-pay | Admitting: Family

## 2020-07-09 ENCOUNTER — Other Ambulatory Visit (INDEPENDENT_AMBULATORY_CARE_PROVIDER_SITE_OTHER): Payer: BC Managed Care – PPO

## 2020-07-09 DIAGNOSIS — R809 Proteinuria, unspecified: Secondary | ICD-10-CM

## 2020-07-09 MED ORDER — DOXYCYCLINE HYCLATE 100 MG PO TABS
100.0000 mg | ORAL_TABLET | Freq: Two times a day (BID) | ORAL | 0 refills | Status: AC
Start: 2020-07-09 — End: 2020-07-16

## 2020-07-09 NOTE — Telephone Encounter (Signed)
Rx sent to desired pharmacy, Pt scheduled for lab appointment for rpt UA.

## 2020-07-09 NOTE — Telephone Encounter (Signed)
Left voicemail to return call. 

## 2020-07-09 NOTE — Telephone Encounter (Addendum)
Marcus Robbins- Please advise pt that his urine culture is negative, but he has tested positive for chlamydia.  Recommend that he start doxycycline 100mg  twice daily for 7 days. It is important that he complete the entire course.  He should notify all partners and they will need to complete treatment prior to any sexual contact to avoid re-infection.  Also, I would like him to repeat a urine in the lab. The dip we did showed some protein and I would like to double check in the lab.   , could you please notify the health department? Thanks.

## 2020-07-09 NOTE — Telephone Encounter (Signed)
Pt returning your call.   Telephone: [phone]

## 2020-07-10 LAB — URINALYSIS, ROUTINE W REFLEX MICROSCOPIC
Bilirubin Urine: NEGATIVE
Hgb urine dipstick: NEGATIVE
Ketones, ur: NEGATIVE
Leukocytes,Ua: NEGATIVE
Nitrite: NEGATIVE
RBC / HPF: NONE SEEN (ref 0–?)
Specific Gravity, Urine: >=1.03 — AB (ref 1.000–1.030)
Total Protein, Urine: NEGATIVE
Urine Glucose: NEGATIVE
Urobilinogen, UA: 0.2 (ref 0.0–1.0)
pH: 6 (ref 5.0–8.0)

## 2020-07-10 NOTE — Progress Notes (Signed)
Order(s) created erroneously. Erroneous order ID: [id number]  Order moved by: Deatra Canter  Order move date/time: 07/10/2020 6:27 AM  Source Patient: [id number]  Source Contact: 07/05/2020  Destination Patient: [id number]  Destination Contact: 09/06/2012

## 2020-07-10 NOTE — Telephone Encounter (Signed)
Confidential Communicable Disease Report completed and faxed to [facility] at [phone], attn: Cheryln Manly. Fax confirmation received.

## 2023-07-20 ENCOUNTER — Encounter (HOSPITAL_COMMUNITY): Payer: Self-pay | Admitting: *Deleted

## 2023-07-20 ENCOUNTER — Ambulatory Visit (HOSPITAL_COMMUNITY)
Admission: EM | Admit: 2023-07-20 | Discharge: 2023-07-20 | Disposition: A | Discharge status: Home / Self Care | Payer: 59 | Attending: Emergency Medicine | Admitting: Emergency Medicine

## 2023-07-20 DIAGNOSIS — J101 Influenza due to other identified influenza virus with other respiratory manifestations: Secondary | ICD-10-CM | POA: Diagnosis not present

## 2023-07-20 LAB — POC COVID19/FLU A&B COMBO
Covid Antigen, POC: NEGATIVE
Influenza A Antigen, POC: POSITIVE — AB
Influenza B Antigen, POC: NEGATIVE

## 2023-07-20 MED ORDER — OSELTAMIVIR PHOSPHATE 75 MG PO CAPS: 75.0000 mg | ORAL_CAPSULE | Freq: Two times a day (BID) | ORAL | 0 refills | Status: AC

## 2023-07-20 NOTE — Discharge Instructions (Addendum)
Start taking Tamiflu twice daily for 5 days. Alternate between ibuprofen and Tylenol as needed for body aches and fever. You can continue taking Mucinex for cough and congestion. Avoid taking Sudafed due to elevated blood pressure today. I recommend getting established with a primary care doctor regarding your blood pressure. Stay hydrated and get plenty of rest. Return here if symptoms persist or worsen.

## 2023-07-20 NOTE — ED Triage Notes (Signed)
Pt states he has had cough, headache, chills since Sunday. He has been taking sudafed and mucinex.

## 2023-07-20 NOTE — ED Provider Notes (Signed)
[facility]    CSN: [id number] Arrival date & time: 07/20/23  1507      History   Chief Complaint Chief Complaint  Patient presents with   Chills   Headache   Cough    HPI Marcus Robbins is a 43 y.o. male.   Patient presents with cough, headache, chills, and body aches that began Sunday night. Patient has been taking sudafed and Mucinex with some relief.   Headache Associated symptoms: congestion, cough, fatigue and fever   Cough Associated symptoms: chills, fever, headaches and rhinorrhea   Associated symptoms: no chest pain, no shortness of breath and no wheezing     History reviewed. No pertinent past medical history.  Patient Active Problem List   Diagnosis Date Noted   Myopia of both eyes 07/30/2016    History reviewed. No pertinent surgical history.     Home Medications    Prior to Admission medications   Medication Sig Start Date End Date Taking? Authorizing Provider  oseltamivir (TAMIFLU) 75 MG capsule Take 1 capsule (75 mg total) by mouth every 12 (twelve) hours. 07/20/23  Yes Letta Kocher, NP    Family History Family History  Problem Relation Age of Onset   Hypertension Mother    Seizures Sister     Social History Social History   Tobacco Use   Smoking status: Never   Smokeless tobacco: Never  Vaping Use   Vaping status: Never Used  Substance Use Topics   Alcohol use: No   Drug use: No     Allergies   Meloxicam   Review of Systems Review of Systems  Constitutional:  Positive for chills, fatigue and fever.  HENT:  Positive for congestion and rhinorrhea.   Respiratory:  Positive for cough. Negative for chest tightness, shortness of breath and wheezing.   Cardiovascular:  Negative for chest pain.  Musculoskeletal:  Positive for arthralgias.  Neurological:  Positive for headaches.     Physical Exam Triage Vital Signs ED Triage Vitals  Encounter Vitals Group     BP 07/20/23 1558 (!) 156/109     Systolic BP  Percentile --      Diastolic BP Percentile --      Pulse Rate 07/20/23 1558 99     Resp 07/20/23 1558 18     Temp 07/20/23 1558 99.8 F (37.7 C)     Temp Source 07/20/23 1558 Oral     SpO2 07/20/23 1558 95 %     Weight --      Height --      Head Circumference --      Peak Flow --      Pain Score 07/20/23 1557 3     Pain Loc --      Pain Education --      Exclude from Growth Chart --    No data found.  Updated Vital Signs BP (!) 156/109 (BP Location: Right Arm)   Pulse 99   Temp 99.8 F (37.7 C) (Oral)   Resp 18   SpO2 95%   Visual Acuity Right Eye Distance:   Left Eye Distance:   Bilateral Distance:    Right Eye Near:   Left Eye Near:    Bilateral Near:     Physical Exam Vitals and nursing note reviewed.  Constitutional:      General: He is awake. He is not in acute distress.    Appearance: Normal appearance. He is well-developed and well-groomed. He is not ill-appearing.  HENT:  Right Ear: Tympanic membrane, ear canal and external ear normal.     Left Ear: Tympanic membrane, ear canal and external ear normal.     Nose: Congestion and rhinorrhea present.     Mouth/Throat:     Mouth: Mucous membranes are moist.     Pharynx: Posterior oropharyngeal erythema present. No oropharyngeal exudate.  Eyes:     Extraocular Movements: Extraocular movements intact.     Pupils: Pupils are equal, round, and reactive to light.  Cardiovascular:     Rate and Rhythm: Normal rate and regular rhythm.  Pulmonary:     Effort: Pulmonary effort is normal.     Breath sounds: Normal breath sounds.  Musculoskeletal:        General: Normal range of motion.     Cervical back: Normal range of motion and neck supple.  Skin:    General: Skin is warm and dry.  Neurological:     Mental Status: He is alert and oriented to person, place, and time. Mental status is at baseline.     GCS: GCS eye subscore is 4. GCS verbal subscore is 5. GCS motor subscore is 6.  Psychiatric:         Behavior: Behavior is cooperative.      UC Treatments / Results  Labs (all labs ordered are listed, but only abnormal results are displayed) Labs Reviewed  POC COVID19/FLU A&B COMBO - Abnormal; Notable for the following components:      Result Value   Influenza A Antigen, POC Positive (*)    All other components within normal limits    EKG   Radiology No results found.  Procedures Procedures (including critical care time)  Medications Ordered in UC Medications - No data to display  Initial Impression / Assessment and Plan / UC Course  I have reviewed the triage vital signs and the nursing notes.  Pertinent labs & imaging results that were available during my care of the patient were reviewed by me and considered in my medical decision making (see chart for details).     Patient presented with cough, headache, chills, and body aches that began Sunday night. Patient reports taking Sudafed and Mucinex of symptoms.   Upon assessment congestion and rhinorrhea are present. Mild erythema and postnasal drip noted to pharynx. Lungs clear bilaterally on auscultation.   Patient is hypertensive at 156/109. Denies any related symptoms. No neurodeficits noted on exam. GCS 15. EOMI and PERRLA. Offered to have clinical staff set patient up with a primary care doctor today to help with blood pressure management, but patient declined. Informed patient not to take sudafed as this could increase blood pressure.   Tested positive for influenza A. Prescribed Tamiflu. Recommended over-the-counter medication for symptoms. Discussed follow-up and return precautions.  Final Clinical Impressions(s) / UC Diagnoses   Final diagnoses:  Influenza A     Discharge Instructions      Start taking Tamiflu twice daily for 5 days. Alternate between ibuprofen and Tylenol as needed for body aches and fever. You can continue taking Mucinex for cough and congestion. Avoid taking Sudafed due to elevated  blood pressure today. I recommend getting established with a primary care doctor regarding your blood pressure. Stay hydrated and get plenty of rest. Return here if symptoms persist or worsen.      ED Prescriptions     Medication Sig Dispense Auth. Provider   oseltamivir (TAMIFLU) 75 MG capsule Take 1 capsule (75 mg total) by mouth every 12 (twelve) hours.  10 capsule Wynonia Lawman A, NP      PDMP not reviewed this encounter.   Wynonia Lawman A, NP 07/20/23 1755
